# Patient Record
Sex: Female | Born: 1995 | Race: White | Hispanic: No | Marital: Single | Smoking: Never smoker
Health system: Southern US, Community
[De-identification: ages and names within clinical notes are randomized; demographics above are authoritative.]

## PROBLEM LIST (undated history)

## (undated) DIAGNOSIS — J45909 Unspecified asthma, uncomplicated: Secondary | ICD-10-CM

## (undated) HISTORY — PX: WISDOM TOOTH EXTRACTION: SHX21

---
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
## 2017-01-02 DIAGNOSIS — J209 Acute bronchitis, unspecified: Secondary | ICD-10-CM | POA: Diagnosis not present

## 2017-05-06 DIAGNOSIS — Z0000 Encounter for general adult medical examination without abnormal findings: Secondary | ICD-10-CM | POA: Diagnosis not present

## 2017-05-06 DIAGNOSIS — J4520 Mild intermittent asthma, uncomplicated: Secondary | ICD-10-CM | POA: Diagnosis not present

## 2020-07-19 ENCOUNTER — Emergency Department (HOSPITAL_COMMUNITY)
Admission: EM | Admit: 2020-07-19 | Discharge: 2020-07-19 | Disposition: A | Discharge status: Home / Self Care | Payer: 59 | Attending: Emergency Medicine | Admitting: Emergency Medicine

## 2020-07-19 ENCOUNTER — Encounter (HOSPITAL_COMMUNITY): Payer: Self-pay

## 2020-07-19 ENCOUNTER — Other Ambulatory Visit: Payer: Self-pay

## 2020-07-19 DIAGNOSIS — J45909 Unspecified asthma, uncomplicated: Secondary | ICD-10-CM | POA: Diagnosis not present

## 2020-07-19 DIAGNOSIS — R112 Nausea with vomiting, unspecified: Secondary | ICD-10-CM | POA: Diagnosis not present

## 2020-07-19 DIAGNOSIS — Z20822 Contact with and (suspected) exposure to covid-19: Secondary | ICD-10-CM | POA: Insufficient documentation

## 2020-07-19 DIAGNOSIS — R519 Headache, unspecified: Secondary | ICD-10-CM | POA: Diagnosis present

## 2020-07-19 HISTORY — DX: Unspecified asthma, uncomplicated: J45.909

## 2020-07-19 LAB — CBC
HCT: 40.9 % (ref 36.0–46.0)
Hemoglobin: 13.5 g/dL (ref 12.0–15.0)
MCH: 30.3 pg (ref 26.0–34.0)
MCHC: 33 g/dL (ref 30.0–36.0)
MCV: 91.9 fL (ref 80.0–100.0)
Platelets: 245 10*3/uL (ref 150–400)
RBC: 4.45 MIL/uL (ref 3.87–5.11)
RDW: 12.9 % (ref 11.5–15.5)
WBC: 4.3 10*3/uL (ref 4.0–10.5)
nRBC: 0 % (ref 0.0–0.2)

## 2020-07-19 LAB — COMPREHENSIVE METABOLIC PANEL
ALT: 13 U/L (ref 0–44)
AST: 20 U/L (ref 15–41)
Albumin: 4.3 g/dL (ref 3.5–5.0)
Alkaline Phosphatase: 46 U/L (ref 38–126)
Anion gap: 9 (ref 5–15)
BUN: 15 mg/dL (ref 6–20)
CO2: 24 mmol/L (ref 22–32)
Calcium: 9.4 mg/dL (ref 8.9–10.3)
Chloride: 105 mmol/L (ref 98–111)
Creatinine, Ser: 0.73 mg/dL (ref 0.44–1.00)
GFR, Estimated: >60 mL/min (ref >60)
Glucose, Bld: 119 mg/dL — ABNORMAL HIGH (ref 70–99)
Potassium: 3.5 mmol/L (ref 3.5–5.1)
Sodium: 138 mmol/L (ref 135–145)
Total Bilirubin: 1.5 mg/dL — ABNORMAL HIGH (ref 0.3–1.2)
Total Protein: 7.5 g/dL (ref 6.5–8.1)

## 2020-07-19 LAB — URINALYSIS, ROUTINE W REFLEX MICROSCOPIC
Bilirubin Urine: NEGATIVE
Glucose, UA: NEGATIVE mg/dL
Hgb urine dipstick: NEGATIVE
Ketones, ur: 20 mg/dL — AB
Leukocytes,Ua: NEGATIVE
Nitrite: NEGATIVE
Protein, ur: NEGATIVE mg/dL
Specific Gravity, Urine: 1.01 (ref 1.005–1.030)
pH: 7 (ref 5.0–8.0)

## 2020-07-19 LAB — RESP PANEL BY RT-PCR (FLU A&B, COVID) ARPGX2
Influenza A by PCR: NEGATIVE
Influenza B by PCR: NEGATIVE
SARS Coronavirus 2 by RT PCR: NEGATIVE

## 2020-07-19 LAB — I-STAT BETA HCG BLOOD, ED (MC, WL, AP ONLY): I-stat hCG, quantitative: <5 m[IU]/mL (ref <5)

## 2020-07-19 LAB — LIPASE, BLOOD: Lipase: 26 U/L (ref 11–51)

## 2020-07-19 MED ORDER — SODIUM CHLORIDE 0.9 % IV BOLUS
1000.0000 mL | Freq: Once | INTRAVENOUS | Status: AC
  Administered 2020-07-19: 1000 mL via INTRAVENOUS

## 2020-07-19 MED ORDER — ONDANSETRON 4 MG PO TBDP: 4.0000 mg | ORAL_TABLET | Freq: Three times a day (TID) | ORAL | 0 refills | Status: AC | PRN

## 2020-07-19 MED ORDER — KETOROLAC TROMETHAMINE 15 MG/ML IJ SOLN
15.0000 mg | Freq: Once | INTRAMUSCULAR | Status: AC
  Administered 2020-07-19: 15 mg via INTRAVENOUS
  Filled 2020-07-19: qty 1

## 2020-07-19 MED ORDER — METOCLOPRAMIDE HCL 5 MG/ML IJ SOLN
10.0000 mg | Freq: Once | INTRAMUSCULAR | Status: AC
  Administered 2020-07-19: 10 mg via INTRAVENOUS
  Filled 2020-07-19: qty 2

## 2020-07-19 MED ORDER — DEXAMETHASONE SODIUM PHOSPHATE 10 MG/ML IJ SOLN
10.0000 mg | Freq: Once | INTRAMUSCULAR | Status: AC
  Administered 2020-07-19: 10 mg via INTRAVENOUS
  Filled 2020-07-19: qty 1

## 2020-07-19 NOTE — ED Provider Notes (Signed)
[facility]-EMERGENCY DEPT Provider Note   CSN: [id number] Arrival date & time: 07/19/20  1005     History Chief Complaint  Patient presents with  . Headache  . Emesis    Tiffany Winters is a 25 y.o. female.  HPI      Tiffany Winters is a 25 y.o. female, with a history of asthma, presenting to the ED with headache beginning this morning after waking.  Pain is to the right forehead just above the eyebrow, waxing and waning, difficult to describe, currently 5/10.  Accompanied by nausea and vomiting today.  She also notes preceding symptoms of nasal congestion with rhinorrhea colored yellow or green beginning around May 14.  Denies fever/chills, vision changes, neck pain/stiffness, dizziness, syncope, neurologic deficits, chest pain, shortness of breath, cough, abdominal pain, diarrhea, rash, or any other complaints.   Past Medical History:  Diagnosis Date  . Asthma     There are no problems to display for this patient.   Past Surgical History:  Procedure Laterality Date  . WISDOM TOOTH EXTRACTION       OB History   No obstetric history on file.     Family History  Family history unknown: Yes    Social History   Tobacco Use  . Smoking status: Never Smoker  . Smokeless tobacco: Never Used  Vaping Use  . Vaping Use: Never used  Substance Use Topics  . Alcohol use: Yes  . Drug use: Never    Home Medications Prior to Admission medications   Medication Sig Start Date End Date Taking? Authorizing Provider  albuterol (VENTOLIN HFA) 108 (90 Base) MCG/ACT inhaler Inhale 2 puffs into the lungs every 6 (six) hours as needed for wheezing or shortness of breath. 05/22/20  Yes [provider]  fexofenadine (ALLEGRA) 180 MG tablet Take 180 mg by mouth daily.   Yes [provider]  ibuprofen (ADVIL) 200 MG tablet Take 200 mg by mouth every 6 (six) hours as needed for fever, mild pain or headache.   Yes [provider]   Levonorgestrel-Eth Estradiol (TWIRLA) 120-30 MCG/24HR PTWK Apply 1 patch topically as directed. Wear 1 patch weekly for 3 weeks then 1 week with no patch, repeat   Yes [provider]  ondansetron (ZOFRAN ODT) 4 MG disintegrating tablet Take 1 tablet (4 mg total) by mouth every 8 (eight) hours as needed for nausea or vomiting. 07/19/20  Yes Hale Chalfin C, PA-C  sodium chloride (OCEAN) 0.65 % SOLN nasal spray Place 1 spray into both nostrils as needed for congestion.   Yes [provider]    Allergies    Patient has no known allergies.  Review of Systems   Review of Systems  Constitutional: Negative for chills, diaphoresis and fever.  Respiratory: Negative for cough and shortness of breath.   Cardiovascular: Negative for chest pain.  Gastrointestinal: Positive for nausea and vomiting. Negative for abdominal pain, blood in stool and diarrhea.  Musculoskeletal: Negative for back pain and neck pain.  Neurological: Positive for headaches. Negative for dizziness, seizures, syncope, facial asymmetry, speech difficulty, weakness and numbness.  All other systems reviewed and are negative.   Physical Exam Updated Vital Signs BP 107/68 (BP Location: Left Arm)   Pulse 77   Temp 97.7 F (36.5 C) (Oral)   Resp 18   Ht 5\' 4"  (1.626 m)   Wt 61.7 kg   LMP 07/12/2020   SpO2 100%   BMI 23.34 kg/m   Physical Exam  Vitals and nursing note reviewed.  Constitutional:      General: She is not in acute distress.    Appearance: She is well-developed. She is not diaphoretic.  HENT:     Head: Normocephalic and atraumatic.     Mouth/Throat:     Mouth: Mucous membranes are moist.     Pharynx: Oropharynx is clear.  Eyes:     Conjunctiva/sclera: Conjunctivae normal.  Neck:     Comments: Full range of motion in the patient's head and neck.  No change in symptoms, especially no worsening in symptoms, with movement of the head or neck. Cardiovascular:     Rate and Rhythm: Normal rate  and regular rhythm.     Pulses: Normal pulses.          Radial pulses are 2+ on the right side and 2+ on the left side.     Comments: Tactile temperature in the extremities appropriate and equal bilaterally. Pulmonary:     Effort: Pulmonary effort is normal. No respiratory distress.     Breath sounds: Normal breath sounds.  Abdominal:     Palpations: Abdomen is soft.     Tenderness: There is no abdominal tenderness. There is no guarding.  Musculoskeletal:     Cervical back: Full passive range of motion without pain, normal range of motion and neck supple.  Lymphadenopathy:     Cervical: No cervical adenopathy.  Skin:    General: Skin is warm and dry.  Neurological:     Mental Status: She is alert.     Comments: No noted acute cognitive deficit. Sensation grossly intact to light touch in the extremities.   Grip strengths equal bilaterally.   Strength 5/5 in all extremities.  No gait disturbance.  Coordination intact.  Cranial nerves III-XII grossly intact.  Handles oral secretions without noted difficulty.  No noted phonation or speech deficit. No facial droop.   Psychiatric:        Mood and Affect: Mood and affect normal.        Speech: Speech normal.        Behavior: Behavior normal.     ED Results / Procedures / Treatments   Labs (all labs ordered are listed, but only abnormal results are displayed) Labs Reviewed  COMPREHENSIVE METABOLIC PANEL - Abnormal; Notable for the following components:      Result Value   Glucose, Bld 119 (*)    Total Bilirubin 1.5 (*)    All other components within normal limits  URINALYSIS, ROUTINE W REFLEX MICROSCOPIC - Abnormal; Notable for the following components:   Ketones, ur 20 (*)    All other components within normal limits  RESP PANEL BY RT-PCR (FLU A&B, COVID) ARPGX2  LIPASE, BLOOD  CBC  I-STAT BETA HCG BLOOD, ED ([facility], [facility], [facility] ONLY)    EKG None  Radiology No results found.  Procedures Procedures   Medications Ordered in  ED Medications  ketorolac (TORADOL) 15 MG/ML injection 15 mg (15 mg Intravenous Given 07/19/20 1216)  metoCLOPramide (REGLAN) injection 10 mg (10 mg Intravenous Given 07/19/20 1217)  dexamethasone (DECADRON) injection 10 mg (10 mg Intravenous Given 07/19/20 1216)  sodium chloride 0.9 % bolus 1,000 mL (0 mLs Intravenous Stopped 07/19/20 1457)    ED Course  I have reviewed the triage vital signs and the nursing notes.  Pertinent labs & imaging results that were available during my care of the patient were reviewed by me and considered in my medical decision making (see chart for details).  MDM Rules/Calculators/A&P                          Patient presents with headache, nausea, vomiting. Patient is nontoxic appearing, afebrile, not tachycardic, not tachypneic, not hypotensive, maintains excellent SPO2 on room air, and is in no apparent distress.   I have reviewed the patient's chart to obtain more information.   I reviewed and interpreted the patient's labs. Lab work overall reassuring.  Ketonuria likely due to dehydration. She has no neurologic deficits.  My suspicion for acute bacterial infection is low upon consideration of the patient's physical exam and vital signs.  A viral sinusitis is certainly possible. Patient had complete relief in her symptoms.  Tolerating oral fluids prior to discharge.  The patient was given instructions for home care as well as return precautions. Patient voices understanding of these instructions, accepts the plan, and is comfortable with discharge.     Final Clinical Impression(s) / ED Diagnoses Final diagnoses:  Frontal headache    Rx / DC Orders ED Discharge Orders         Ordered    ondansetron (ZOFRAN ODT) 4 MG disintegrating tablet  Every 8 hours PRN        07/19/20 1433           Anselm Pancoast, PA-C 07/19/20 1516    Terald Sleeper, MD 07/19/20 1800

## 2020-07-19 NOTE — Discharge Instructions (Signed)
Headache Your lab results showed no acute abnormalities.  For future headaches please try the following regimen: Antiinflammatory medications: Take 600 mg of ibuprofen every 6 hours or 440 mg (over the counter dose) to 500 mg (prescription dose) of naproxen every 12 hours.  Use this regimen until headache subsides for up to 3 days .  After this time, these medications may be used as needed for pain. Take these medications with food to avoid upset stomach. Choose only one of these medications, do not take them together. Acetaminophen: Should you continue to have additional pain while taking the ibuprofen or naproxen, you may add in acetaminophen (generic for Tylenol) as needed. Your daily total maximum amount of acetaminophen from all sources should be limited to 4000mg /day for persons without liver problems, or 2000mg /day for those with liver problems.  Hydration: Have a goal of about a half liter of water every couple hours to stay well hydrated.   Sleep: Please be sure to get plenty of sleep with a goal of 8 hours per night. Having a regular bed time and bedtime routine can help with this.  Screens: Reduce the amount of time you are in front of screens.  Take about a 5-10-minute break every hour or every couple hours to give your eyes rest.  Do not use screens in dark rooms.  Glasses with a blue light filter may also help reduce eye fatigue.  Stress: Take steps to reduce stress as much as possible.  Nausea/vomiting: Use the ondansetron (generic for Zofran) for nausea or vomiting.  This medication may not prevent all vomiting or nausea, but can help facilitate better hydration. Things that can help with nausea/vomiting also include peppermint/menthol candies, vitamin B12, and ginger.  Follow up: Follow-up with your primary care provider on this issue.

## 2020-07-19 NOTE — ED Triage Notes (Signed)
Patient c/o headache and vomiting since this AM.

## 2020-10-30 ENCOUNTER — Encounter (HOSPITAL_BASED_OUTPATIENT_CLINIC_OR_DEPARTMENT_OTHER): Payer: Self-pay

## 2020-10-30 ENCOUNTER — Emergency Department (HOSPITAL_BASED_OUTPATIENT_CLINIC_OR_DEPARTMENT_OTHER)
Admission: EM | Admit: 2020-10-30 | Discharge: 2020-10-30 | Disposition: A | Discharge status: Home / Self Care | Payer: 59 | Attending: Emergency Medicine | Admitting: Emergency Medicine

## 2020-10-30 ENCOUNTER — Other Ambulatory Visit: Payer: Self-pay

## 2020-10-30 ENCOUNTER — Emergency Department (HOSPITAL_BASED_OUTPATIENT_CLINIC_OR_DEPARTMENT_OTHER): Payer: 59 | Admitting: Radiology

## 2020-10-30 DIAGNOSIS — J4521 Mild intermittent asthma with (acute) exacerbation: Secondary | ICD-10-CM

## 2020-10-30 DIAGNOSIS — J069 Acute upper respiratory infection, unspecified: Secondary | ICD-10-CM | POA: Diagnosis not present

## 2020-10-30 DIAGNOSIS — R059 Cough, unspecified: Secondary | ICD-10-CM | POA: Diagnosis present

## 2020-10-30 MED ORDER — ALBUTEROL SULFATE HFA 108 (90 BASE) MCG/ACT IN AERS: 1.0000 | INHALATION_SPRAY | RESPIRATORY_TRACT | 1 refills | Status: AC | PRN

## 2020-10-30 MED ORDER — PREDNISONE 20 MG PO TABS: ORAL_TABLET | ORAL | 0 refills | Status: AC

## 2020-10-30 NOTE — ED Triage Notes (Signed)
Patient arrives from home with c/o cough and sob x 3 days. Patient reports cough was productive at first, now she has to work to get stuff to come up.

## 2020-10-30 NOTE — ED Provider Notes (Signed)
[facility] EMERGENCY DEPT Provider Note   CSN: [id number] Arrival date & time: 10/30/20  2009     History Chief Complaint  Patient presents with   Cough   Shortness of Breath    Tiffany Winters is a 25 y.o. female.  Patient with history of asthma presents with cough wheezing shortness of breath for 3 days.  This feels overall similar to asthma history.  Patient's been on oral steroids and albuterol as needed.  Patient with albuterol multiple times a day but no improvement.  COVID last month no recent fevers.      Past Medical History:  Diagnosis Date   Asthma     There are no problems to display for this patient.   Past Surgical History:  Procedure Laterality Date   WISDOM TOOTH EXTRACTION       OB History   No obstetric history on file.     Family History  Family history unknown: Yes    Social History   Tobacco Use   Smoking status: Never   Smokeless tobacco: Never  Vaping Use   Vaping Use: Never used  Substance Use Topics   Alcohol use: Yes   Drug use: Never    Home Medications Prior to Admission medications   Medication Sig Start Date End Date Taking? Authorizing Provider  albuterol (VENTOLIN HFA) 108 (90 Base) MCG/ACT inhaler Inhale 1-2 puffs into the lungs every 4 (four) hours as needed for wheezing or shortness of breath. 10/30/20  Yes Blane Ohara, MD  predniSONE (DELTASONE) 20 MG tablet 2 tabs po daily x 4 days 10/30/20  Yes Blane Ohara, MD  albuterol (VENTOLIN HFA) 108 (90 Base) MCG/ACT inhaler Inhale 2 puffs into the lungs every 6 (six) hours as needed for wheezing or shortness of breath. 05/22/20   [provider]  fexofenadine (ALLEGRA) 180 MG tablet Take 180 mg by mouth daily.    [provider]  ibuprofen (ADVIL) 200 MG tablet Take 200 mg by mouth every 6 (six) hours as needed for fever, mild pain or headache.    [provider]  Levonorgestrel-Eth Estradiol (TWIRLA) 120-30 MCG/24HR PTWK Apply 1  patch topically as directed. Wear 1 patch weekly for 3 weeks then 1 week with no patch, repeat    [provider]  ondansetron (ZOFRAN ODT) 4 MG disintegrating tablet Take 1 tablet (4 mg total) by mouth every 8 (eight) hours as needed for nausea or vomiting. 07/19/20   Joy, Shawn C, PA-C  sodium chloride (OCEAN) 0.65 % SOLN nasal spray Place 1 spray into both nostrils as needed for congestion.    [provider]    Allergies    Patient has no known allergies.  Review of Systems   Review of Systems  Constitutional:  Negative for chills and fever.  HENT:  Positive for congestion.   Eyes:  Negative for visual disturbance.  Respiratory:  Positive for cough and shortness of breath.   Cardiovascular:  Negative for chest pain.  Gastrointestinal:  Negative for abdominal pain and vomiting.  Genitourinary:  Negative for dysuria and flank pain.  Musculoskeletal:  Negative for back pain, neck pain and neck stiffness.  Skin:  Negative for rash.  Neurological:  Negative for light-headedness and headaches.   Physical Exam Updated Vital Signs BP 131/70 (BP Location: Right Arm)   Pulse 91   Temp 98.3 F (36.8 C) (Oral)   Resp 18   Ht 5\' 4"  (1.626 m)   Wt 60.8 kg  LMP 09/26/2020 Comment: On birth control. Irregular.  BMI 23.00 kg/m   Physical Exam Vitals and nursing note reviewed.  Constitutional:      General: She is not in acute distress.    Appearance: She is well-developed.  HENT:     Head: Normocephalic and atraumatic.     Mouth/Throat:     Mouth: Mucous membranes are moist.  Eyes:     General:        Right eye: No discharge.        Left eye: No discharge.     Conjunctiva/sclera: Conjunctivae normal.  Neck:     Trachea: No tracheal deviation.  Cardiovascular:     Rate and Rhythm: Normal rate and regular rhythm.     Heart sounds: No murmur heard. Pulmonary:     Effort: Pulmonary effort is normal.     Breath sounds: Wheezing (minimal) present.  Abdominal:      General: There is no distension.     Palpations: Abdomen is soft.  Musculoskeletal:     Cervical back: Normal range of motion. No rigidity.  Skin:    General: Skin is warm.     Capillary Refill: Capillary refill takes less than 2 seconds.     Findings: No rash.  Neurological:     General: No focal deficit present.     Mental Status: She is alert.  Psychiatric:        Mood and Affect: Mood normal.    ED Results / Procedures / Treatments   Labs (all labs ordered are listed, but only abnormal results are displayed) Labs Reviewed - No data to display  EKG None  Radiology DG Chest 2 View  Result Date: 10/30/2020 CLINICAL DATA:  Cough. EXAM: CHEST - 2 VIEW COMPARISON:  June 14, 2009 FINDINGS: The heart size and mediastinal contours are within normal limits. Both lungs are clear. No visible pleural effusions or pneumothorax. No acute osseous abnormality. IMPRESSION: No active cardiopulmonary disease. Electronically Signed   By: Feliberto Harts M.D.   On: 10/30/2020 20:46    Procedures Procedures   Medications Ordered in ED Medications - No data to display  ED Course  I have reviewed the triage vital signs and the nursing notes.  Pertinent labs & imaging results that were available during my care of the patient were reviewed by me and considered in my medical decision making (see chart for details).    MDM Rules/Calculators/A&P                           Patient presents with clinical concern for acute upper respiratory infection leading to asthma exacerbation.  Chest x-ray reviewed and no acute findings.  Patient moving air well with minimal wheezing.  Discussed continued albuterol, prednisone prescription and outpatient follow-up.  Final Clinical Impression(s) / ED Diagnoses Final diagnoses:  Acute upper respiratory infection  Mild intermittent asthma with acute exacerbation    Rx / DC Orders ED Discharge Orders          Ordered    predniSONE (DELTASONE) 20 MG  tablet        10/30/20 2224    albuterol (VENTOLIN HFA) 108 (90 Base) MCG/ACT inhaler  Every 4 hours PRN        10/30/20 2225             Blane Ohara, MD 10/30/20 2227

## 2020-10-30 NOTE — Discharge Instructions (Addendum)
Use albuterol as needed every 2-4 hours. Take steroids as directed. Return for worsening shortness of breath or new concerns. Use Tylenol as needed every 4 hours for fever or pain.

## 2022-05-12 IMAGING — DX DG CHEST 2V
2 series · 2 of 2 positions shown · non-contrast
Comparison: June 14, 2009

CLINICAL DATA: Cough.

EXAM:
CHEST - 2 VIEW

[chest pa]
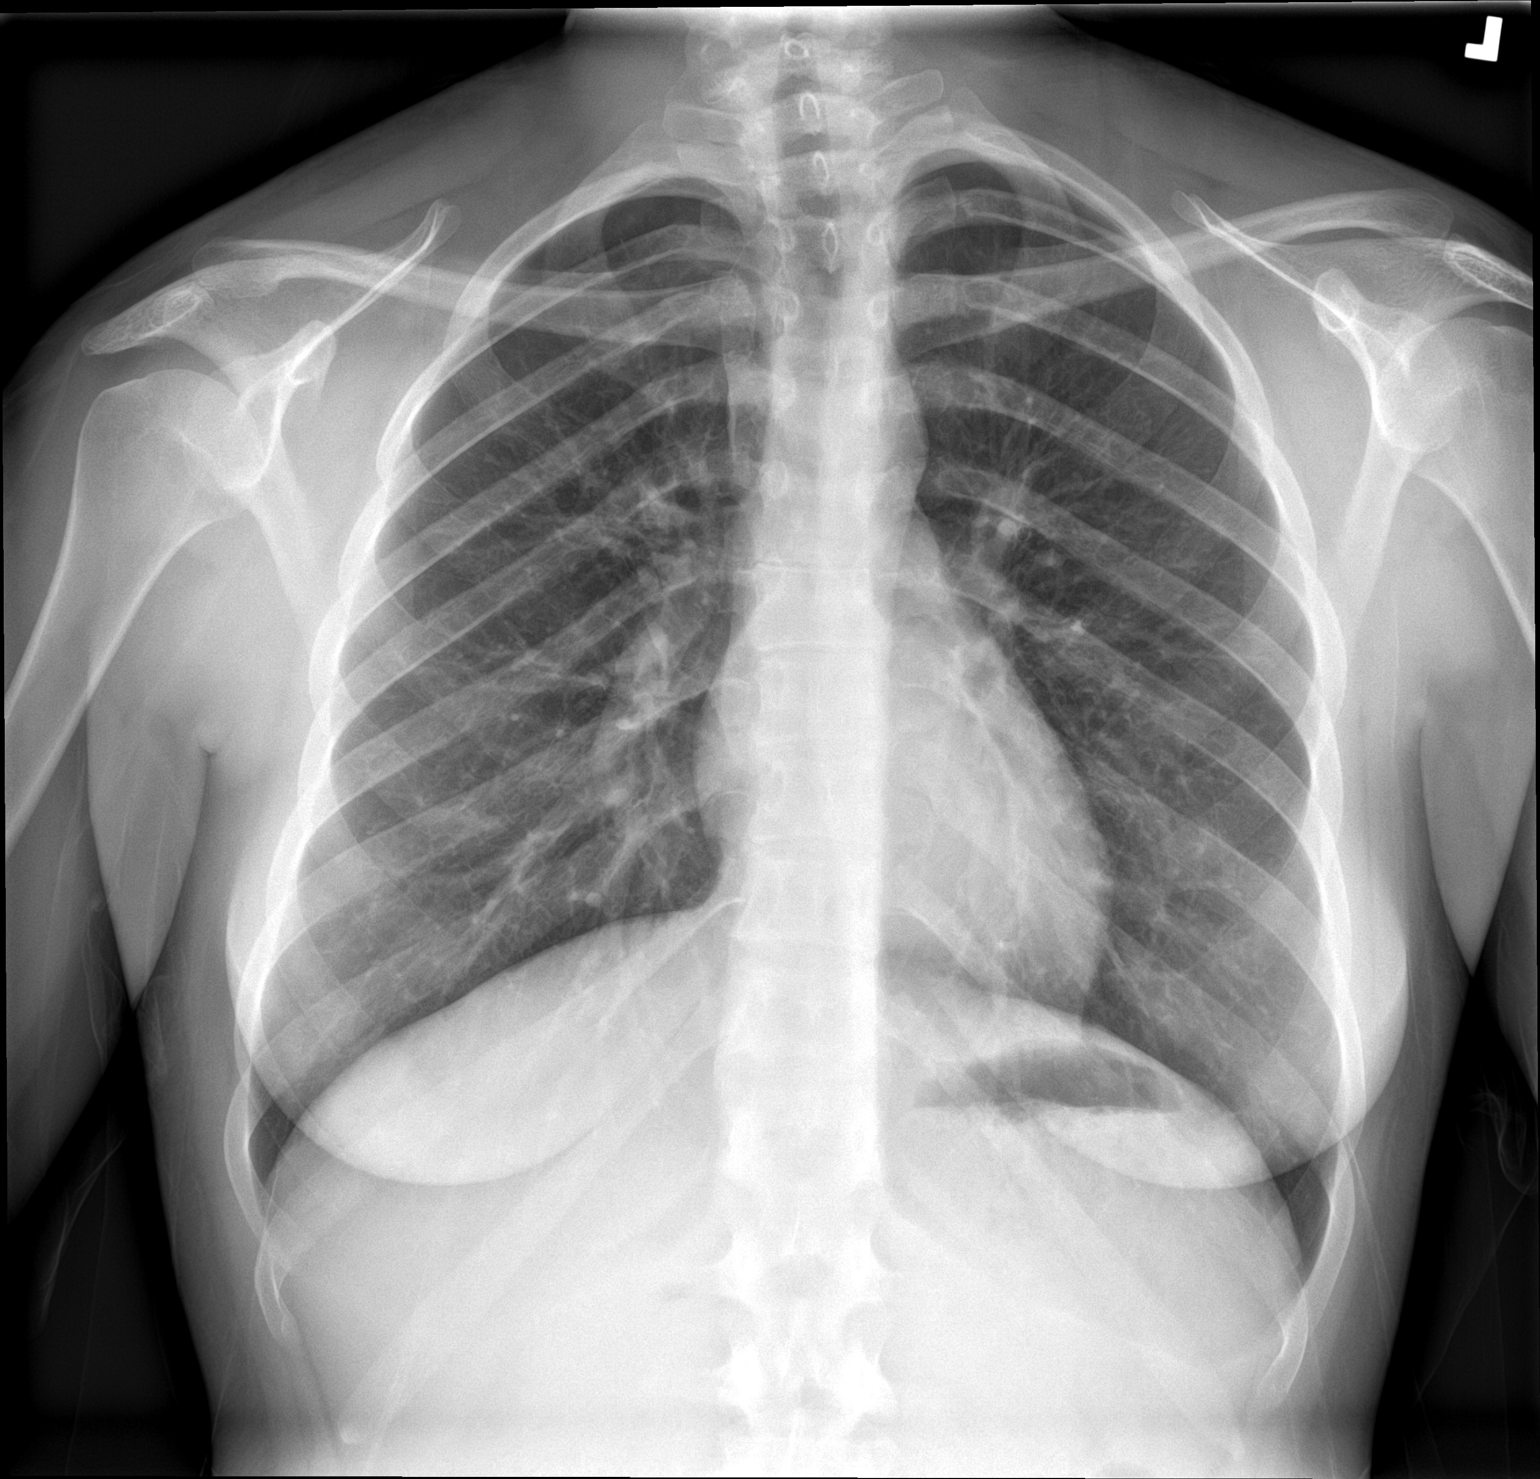

[chest lat]
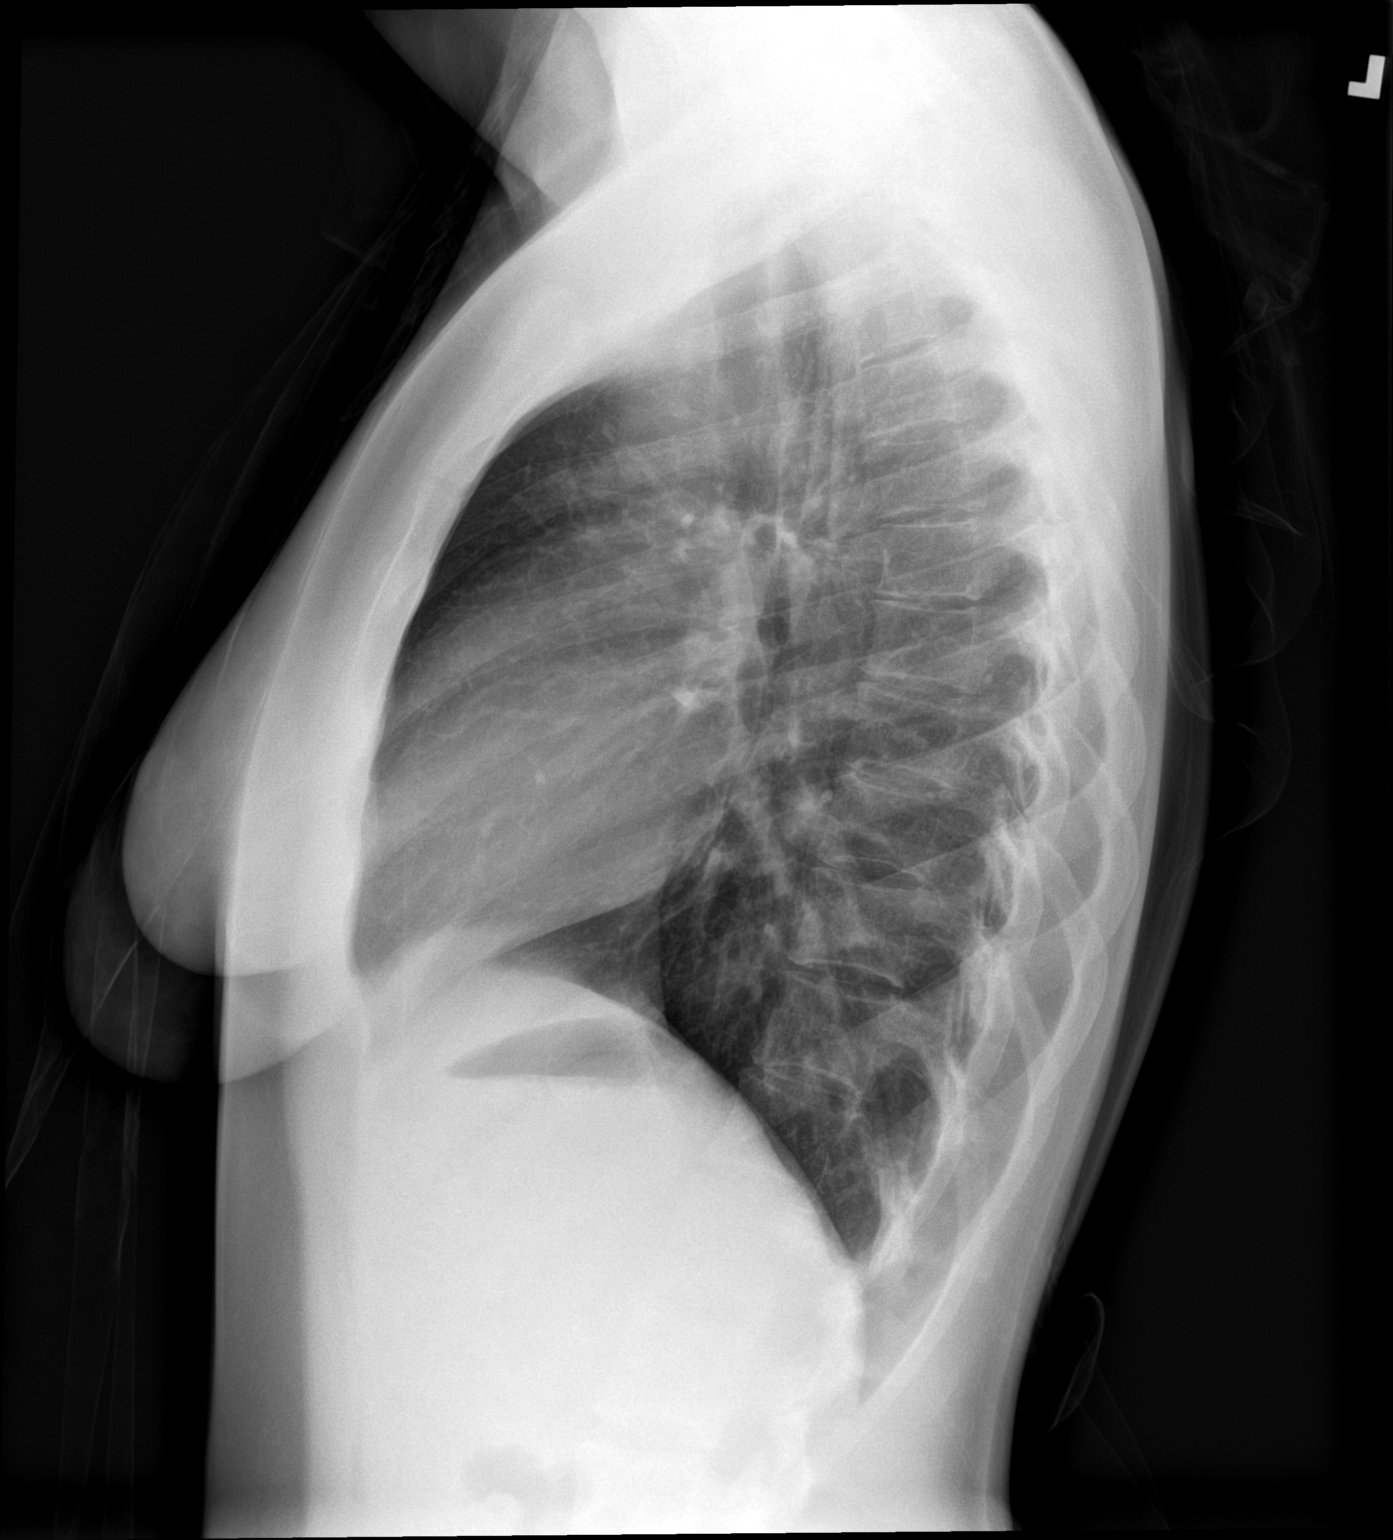

[2 of 2 positions shown; findings below may reference images not displayed]

FINDINGS: The heart size and mediastinal contours are within normal limits.
Both lungs are clear. No visible pleural effusions or pneumothorax.
No acute osseous abnormality.
IMPRESSION: No active cardiopulmonary disease.
